# Patient Record
Sex: Female | Born: 1960 | Race: White | Hispanic: No | Smoking: Current every day smoker
Health system: Southern US, Community
[De-identification: ages and names within clinical notes are randomized; demographics above are authoritative.]

## PROBLEM LIST (undated history)

## (undated) HISTORY — PX: APPENDECTOMY: SHX54

---

## 2001-01-07 DIAGNOSIS — I671 Cerebral aneurysm, nonruptured: Secondary | ICD-10-CM

## 2001-01-07 DIAGNOSIS — I639 Cerebral infarction, unspecified: Secondary | ICD-10-CM

## 2001-01-07 HISTORY — DX: Cerebral aneurysm, nonruptured: I67.1

## 2001-01-07 HISTORY — PX: CEREBRAL ANEURYSM REPAIR: SHX164

## 2001-01-07 HISTORY — DX: Cerebral infarction, unspecified: I63.9

## 2003-02-02 ENCOUNTER — Other Ambulatory Visit: Payer: Self-pay

## 2007-01-18 ENCOUNTER — Emergency Department: Payer: Self-pay | Admitting: Emergency Medicine

## 2015-07-27 ENCOUNTER — Other Ambulatory Visit: Payer: Self-pay | Admitting: Family Medicine

## 2015-07-27 DIAGNOSIS — R590 Localized enlarged lymph nodes: Secondary | ICD-10-CM

## 2015-07-31 ENCOUNTER — Ambulatory Visit: Admission: RE | Admit: 2015-07-31 | Payer: Medicare Other | Source: Ambulatory Visit

## 2019-03-16 ENCOUNTER — Other Ambulatory Visit: Payer: Self-pay | Admitting: Unknown Physician Specialty

## 2019-03-16 DIAGNOSIS — K1370 Unspecified lesions of oral mucosa: Secondary | ICD-10-CM

## 2019-03-16 DIAGNOSIS — D3701 Neoplasm of uncertain behavior of lip: Secondary | ICD-10-CM

## 2019-03-26 ENCOUNTER — Ambulatory Visit
Admission: RE | Admit: 2019-03-26 | Discharge: 2019-03-26 | Disposition: A | Discharge status: Home / Self Care | Payer: Medicare HMO | Source: Ambulatory Visit | Attending: Unknown Physician Specialty | Admitting: Unknown Physician Specialty

## 2019-03-26 ENCOUNTER — Other Ambulatory Visit: Payer: Self-pay

## 2019-03-26 DIAGNOSIS — K1370 Unspecified lesions of oral mucosa: Secondary | ICD-10-CM | POA: Diagnosis present

## 2019-03-26 DIAGNOSIS — D3701 Neoplasm of uncertain behavior of lip: Secondary | ICD-10-CM | POA: Diagnosis present

## 2019-03-26 MED ORDER — IOHEXOL 300 MG/ML  SOLN
75.0000 mL | Freq: Once | INTRAMUSCULAR | Status: AC | PRN
  Administered 2019-03-26: 75 mL via INTRAVENOUS

## 2019-04-16 ENCOUNTER — Inpatient Hospital Stay: Admission: RE | Admit: 2019-04-16 | Payer: Medicare Other | Source: Ambulatory Visit

## 2019-04-23 ENCOUNTER — Other Ambulatory Visit: Payer: Self-pay

## 2019-04-23 ENCOUNTER — Other Ambulatory Visit: Payer: Medicare Other

## 2019-04-23 ENCOUNTER — Encounter
Admission: RE | Admit: 2019-04-23 | Discharge: 2019-04-23 | Disposition: A | Discharge status: Home / Self Care | Payer: Medicare Other | Source: Ambulatory Visit | Attending: Unknown Physician Specialty | Admitting: Unknown Physician Specialty

## 2019-04-23 NOTE — Patient Instructions (Addendum)
Your procedure is scheduled on: 05-04-19 TUESDAY Report to Same Day Surgery 2nd floor medical mall [facility] Entrance-take elevator on left to 2nd floor.  Check in with surgery information desk.) To find out your arrival time please call [phone] between 1PM - 3PM on 05-03-19 MONDAY  Remember: Instructions that are not followed completely may result in serious medical risk, up to and including death, or upon the discretion of your surgeon and anesthesiologist your surgery may need to be rescheduled.    _x___ 1. Do not eat food after midnight the night before your procedure. NO GUM OR CANDY AFTER MIDNIGHT. You may drink clear liquids up to 2 hours before you are scheduled to arrive at the hospital for your procedure.  Do not drink clear liquids within 2 hours of your scheduled arrival to the hospital.  Clear liquids include  --Water or Apple juice without pulp  --Gatorade  --Black Coffee or Clear Tea (No milk, no creamers, do not add anything to  the coffee or Tea   ____Ensure clear carbohydrate drink on the way to the hospital for bariatric patients  ____Ensure clear carbohydrate drink 3 hours before surgery.     __x__ 2. No Alcohol for 24 hours before or after surgery.   __x__3. No Smoking or e-cigarettes for 24 prior to surgery.  Do not use any chewable tobacco products for at least 6 hour prior to surgery   ____  4. Bring all medications with you on the day of surgery if instructed.    __x__ 5. Notify your doctor if there is any change in your medical condition     (cold, fever, infections).    x___6. On the morning of surgery brush your teeth with toothpaste and water.  You may rinse your mouth with mouth wash if you wish.  Do not swallow any toothpaste or mouthwash.   Do not wear jewelry, make-up, hairpins, clips or nail polish.  Do not wear lotions, powders, or perfumes. You may wear deodorant.  Do not shave 48 hours prior to surgery. Men may shave face and neck.  Do  not bring valuables to the hospital.    [facility] is not responsible for any belongings or valuables.               Contacts, dentures or bridgework may not be worn into surgery.  Leave your suitcase in the car. After surgery it may be brought to your room.  For patients admitted to the hospital, discharge time is determined by your treatment team.  _  Patients discharged the day of surgery will not be allowed to drive home.  You will need someone to drive you home and stay with you the night of your procedure.    Please read over the following fact sheets that you were given:   [facility] Preparing for Surgery   _x___ TAKE THE FOLLOWING MEDICATION THE MORNING OF SURGERY WITH A SMALL SIP OF WATER. These include:  1. GABAPENTIN (NEURONTIN)  2. BACLOFEN (LIORESAL)  3. CYMBALTA (DULOXETINE)  4.  5.  6.  ____Fleets enema or Magnesium Citrate as directed.   ____ Use CHG Soap or sage wipes as directed on instruction sheet   ____ Use inhalers on the day of surgery and bring to hospital day of surgery  ____ Stop Metformin and Janumet 2 days prior to surgery.    ____ Take 1/2 of usual insulin dose the night before surgery and none on the morning surgery.  ____ Follow recommendations from Cardiologist, Pulmonologist or PCP regarding stopping Aspirin, Coumadin, Plavix ,Eliquis, Effient, or Pradaxa, and Pletal.  X____Stop Anti-inflammatories such as Advil, Aleve, Ibuprofen, Motrin, Naproxen, Naprosyn, Goodies powders or aspirin products 7 DAYS PRIOR TO SURGERY-OK to take Tylenol    ____ Stop supplements until after surgery.    ____ Bring C-Pap to the hospital.

## 2019-04-30 ENCOUNTER — Other Ambulatory Visit
Admission: RE | Admit: 2019-04-30 | Discharge: 2019-04-30 | Disposition: A | Discharge status: Home / Self Care | Payer: Medicare HMO | Source: Ambulatory Visit | Attending: Unknown Physician Specialty | Admitting: Unknown Physician Specialty

## 2019-04-30 ENCOUNTER — Other Ambulatory Visit: Payer: Self-pay

## 2019-04-30 DIAGNOSIS — Z01812 Encounter for preprocedural laboratory examination: Secondary | ICD-10-CM | POA: Insufficient documentation

## 2019-04-30 DIAGNOSIS — Z20822 Contact with and (suspected) exposure to covid-19: Secondary | ICD-10-CM | POA: Diagnosis not present

## 2019-04-30 LAB — CBC
HCT: 42.2 % (ref 36.0–46.0)
Hemoglobin: 14.5 g/dL (ref 12.0–15.0)
MCH: 33.3 pg (ref 26.0–34.0)
MCHC: 34.4 g/dL (ref 30.0–36.0)
MCV: 97 fL (ref 80.0–100.0)
Platelets: 259 10*3/uL (ref 150–400)
RBC: 4.35 MIL/uL (ref 3.87–5.11)
RDW: 12.5 % (ref 11.5–15.5)
WBC: 6.4 10*3/uL (ref 4.0–10.5)
nRBC: 0 % (ref 0.0–0.2)

## 2019-04-30 LAB — BASIC METABOLIC PANEL
Anion gap: 9 (ref 5–15)
BUN: 13 mg/dL (ref 6–20)
CO2: 27 mmol/L (ref 22–32)
Calcium: 9 mg/dL (ref 8.9–10.3)
Chloride: 100 mmol/L (ref 98–111)
Creatinine, Ser: 0.55 mg/dL (ref 0.44–1.00)
GFR calc Af Amer: >60 mL/min (ref >60)
GFR calc non Af Amer: >60 mL/min (ref >60)
Glucose, Bld: 100 mg/dL — ABNORMAL HIGH (ref 70–99)
Potassium: 4 mmol/L (ref 3.5–5.1)
Sodium: 136 mmol/L (ref 135–145)

## 2019-04-30 LAB — SARS CORONAVIRUS 2 (TAT 6-24 HRS): SARS Coronavirus 2: NEGATIVE

## 2019-05-04 ENCOUNTER — Ambulatory Visit: Payer: Medicare HMO

## 2019-05-04 ENCOUNTER — Other Ambulatory Visit: Payer: Self-pay

## 2019-05-04 ENCOUNTER — Encounter: Payer: Self-pay | Admitting: Unknown Physician Specialty

## 2019-05-04 ENCOUNTER — Ambulatory Visit
Admission: RE | Admit: 2019-05-04 | Discharge: 2019-05-04 | Disposition: A | Discharge status: Home / Self Care | Payer: Medicare HMO | Attending: Unknown Physician Specialty | Admitting: Unknown Physician Specialty

## 2019-05-04 ENCOUNTER — Encounter
Admission: RE | Disposition: A | Discharge status: Home / Self Care | Payer: Self-pay | Source: Home / Self Care | Attending: Unknown Physician Specialty

## 2019-05-04 DIAGNOSIS — Z79899 Other long term (current) drug therapy: Secondary | ICD-10-CM | POA: Insufficient documentation

## 2019-05-04 DIAGNOSIS — F17200 Nicotine dependence, unspecified, uncomplicated: Secondary | ICD-10-CM | POA: Insufficient documentation

## 2019-05-04 DIAGNOSIS — D100 Benign neoplasm of lip: Secondary | ICD-10-CM | POA: Insufficient documentation

## 2019-05-04 DIAGNOSIS — G8194 Hemiplegia, unspecified affecting left nondominant side: Secondary | ICD-10-CM | POA: Diagnosis not present

## 2019-05-04 DIAGNOSIS — Z8679 Personal history of other diseases of the circulatory system: Secondary | ICD-10-CM | POA: Diagnosis not present

## 2019-05-04 DIAGNOSIS — D3701 Neoplasm of uncertain behavior of lip: Secondary | ICD-10-CM | POA: Diagnosis present

## 2019-05-04 HISTORY — PX: MASS EXCISION: SHX2000

## 2019-05-04 SURGERY — EXCISION MASS
Anesthesia: General

## 2019-05-04 MED ORDER — PHENYLEPHRINE HCL (PRESSORS) 10 MG/ML IV SOLN
INTRAVENOUS | Status: AC
  Filled 2019-05-04: qty 1

## 2019-05-04 MED ORDER — LIDOCAINE HCL (CARDIAC) PF 100 MG/5ML IV SOSY
PREFILLED_SYRINGE | INTRAVENOUS | Status: DC | PRN
  Administered 2019-05-04: 60 mg via INTRAVENOUS

## 2019-05-04 MED ORDER — MIDAZOLAM HCL 2 MG/2ML IJ SOLN
INTRAMUSCULAR | Status: AC
  Filled 2019-05-04: qty 2

## 2019-05-04 MED ORDER — LACTATED RINGERS IV SOLN: INTRAVENOUS | Status: DC

## 2019-05-04 MED ORDER — GLYCOPYRROLATE 0.2 MG/ML IJ SOLN
INTRAMUSCULAR | Status: DC | PRN
  Administered 2019-05-04: .2 mg via INTRAVENOUS

## 2019-05-04 MED ORDER — LIDOCAINE HCL (PF) 2 % IJ SOLN
INTRAMUSCULAR | Status: AC
  Filled 2019-05-04: qty 5

## 2019-05-04 MED ORDER — ONDANSETRON HCL 4 MG/2ML IJ SOLN: 4.0000 mg | Freq: Once | INTRAMUSCULAR | Status: DC | PRN

## 2019-05-04 MED ORDER — BACITRACIN ZINC 500 UNIT/GM EX OINT
TOPICAL_OINTMENT | CUTANEOUS | Status: AC
  Filled 2019-05-04: qty 28.35

## 2019-05-04 MED ORDER — LIDOCAINE-EPINEPHRINE (PF) 1 %-1:200000 IJ SOLN
INTRAMUSCULAR | Status: DC | PRN
  Administered 2019-05-04: 1 mL

## 2019-05-04 MED ORDER — PROPOFOL 10 MG/ML IV BOLUS
INTRAVENOUS | Status: DC | PRN
  Administered 2019-05-04: 120 mg via INTRAVENOUS
  Administered 2019-05-04: 50 mg via INTRAVENOUS

## 2019-05-04 MED ORDER — ALBUTEROL SULFATE HFA 108 (90 BASE) MCG/ACT IN AERS
INHALATION_SPRAY | RESPIRATORY_TRACT | Status: DC | PRN
  Administered 2019-05-04: 6 via RESPIRATORY_TRACT

## 2019-05-04 MED ORDER — FENTANYL CITRATE (PF) 100 MCG/2ML IJ SOLN
INTRAMUSCULAR | Status: DC | PRN
  Administered 2019-05-04 (×2): 25 ug via INTRAVENOUS
  Administered 2019-05-04: 50 ug via INTRAVENOUS

## 2019-05-04 MED ORDER — ONDANSETRON HCL 4 MG/2ML IJ SOLN
INTRAMUSCULAR | Status: DC | PRN
  Administered 2019-05-04: 4 mg via INTRAVENOUS

## 2019-05-04 MED ORDER — FENTANYL CITRATE (PF) 100 MCG/2ML IJ SOLN
INTRAMUSCULAR | Status: AC
  Filled 2019-05-04: qty 2

## 2019-05-04 MED ORDER — LIDOCAINE-EPINEPHRINE (PF) 1 %-1:200000 IJ SOLN
INTRAMUSCULAR | Status: AC
  Filled 2019-05-04: qty 30

## 2019-05-04 MED ORDER — FENTANYL CITRATE (PF) 100 MCG/2ML IJ SOLN: 25.0000 ug | INTRAMUSCULAR | Status: DC | PRN

## 2019-05-04 MED ORDER — ONDANSETRON HCL 4 MG/2ML IJ SOLN
INTRAMUSCULAR | Status: AC
  Filled 2019-05-04: qty 2

## 2019-05-04 MED ORDER — GLYCOPYRROLATE 0.2 MG/ML IJ SOLN
INTRAMUSCULAR | Status: AC
  Filled 2019-05-04: qty 1

## 2019-05-04 MED ORDER — FAMOTIDINE 20 MG PO TABS: 20.0000 mg | ORAL_TABLET | Freq: Once | ORAL | Status: AC

## 2019-05-04 MED ORDER — DEXAMETHASONE SODIUM PHOSPHATE 10 MG/ML IJ SOLN
INTRAMUSCULAR | Status: AC
  Filled 2019-05-04: qty 1

## 2019-05-04 MED ORDER — MIDAZOLAM HCL 2 MG/2ML IJ SOLN
INTRAMUSCULAR | Status: DC | PRN
  Administered 2019-05-04: 2 mg via INTRAVENOUS

## 2019-05-04 MED ORDER — SUCCINYLCHOLINE CHLORIDE 20 MG/ML IJ SOLN
INTRAMUSCULAR | Status: DC | PRN
  Administered 2019-05-04: 120 mg via INTRAVENOUS

## 2019-05-04 MED ORDER — PROPOFOL 10 MG/ML IV BOLUS
INTRAVENOUS | Status: AC
  Filled 2019-05-04: qty 40

## 2019-05-04 MED ORDER — KETOROLAC TROMETHAMINE 30 MG/ML IJ SOLN
INTRAMUSCULAR | Status: DC | PRN
  Administered 2019-05-04: 30 mg via INTRAVENOUS

## 2019-05-04 MED ORDER — DEXAMETHASONE SODIUM PHOSPHATE 10 MG/ML IJ SOLN
INTRAMUSCULAR | Status: DC | PRN
  Administered 2019-05-04: 10 mg via INTRAVENOUS

## 2019-05-04 MED ORDER — FAMOTIDINE 20 MG PO TABS
ORAL_TABLET | ORAL | Status: AC
  Administered 2019-05-04: 20 mg via ORAL
  Filled 2019-05-04: qty 1

## 2019-05-04 SURGICAL SUPPLY — 29 items
BLADE SURG 15 STRL LF DISP TIS (BLADE) ×1 IMPLANT
BLADE SURG 15 STRL SS (BLADE) ×2
CANISTER SUCT 1200ML W/VALVE (MISCELLANEOUS) ×3 IMPLANT
CORD BIP STRL DISP 12FT (MISCELLANEOUS) IMPLANT
COVER WAND RF STERILE (DRAPES) ×3 IMPLANT
DERMABOND ADVANCED (GAUZE/BANDAGES/DRESSINGS)
DERMABOND ADVANCED .7 DNX12 (GAUZE/BANDAGES/DRESSINGS) IMPLANT
DRAPE MAG INST 16X20 L/F (DRAPES) IMPLANT
DRSG TEGADERM 2-3/8X2-3/4 SM (GAUZE/BANDAGES/DRESSINGS) IMPLANT
DRSG TELFA 4X3 1S NADH ST (GAUZE/BANDAGES/DRESSINGS) ×3 IMPLANT
ELECT CAUTERY BLADE TIP 2.5 (TIP) ×3
ELECT REM PT RETURN 9FT ADLT (ELECTROSURGICAL) ×3
ELECTRODE CAUTERY BLDE TIP 2.5 (TIP) ×1 IMPLANT
ELECTRODE REM PT RTRN 9FT ADLT (ELECTROSURGICAL) ×1 IMPLANT
FORCEPS JEWEL BIP 4-3/4 STR (INSTRUMENTS) IMPLANT
GLOVE BIO SURGEON STRL SZ7.5 (GLOVE) ×3 IMPLANT
GOWN STRL REUS W/ TWL LRG LVL3 (GOWN DISPOSABLE) ×2 IMPLANT
GOWN STRL REUS W/TWL LRG LVL3 (GOWN DISPOSABLE) ×4
LABEL OR SOLS (LABEL) ×3 IMPLANT
NS IRRIG 500ML POUR BTL (IV SOLUTION) ×3 IMPLANT
PACK HEAD/NECK (MISCELLANEOUS) ×3 IMPLANT
SHEARS HARMONIC 9CM CVD (BLADE) ×3 IMPLANT
SPONGE KITTNER 5P (MISCELLANEOUS) ×3 IMPLANT
SUCTION FRAZIER HANDLE 10FR (MISCELLANEOUS) ×2
SUCTION TUBE FRAZIER 10FR DISP (MISCELLANEOUS) ×1 IMPLANT
SUT SILK 2 0 (SUTURE) ×2
SUT SILK 2-0 18XBRD TIE 12 (SUTURE) ×1 IMPLANT
SUT VIC AB 4-0 FS2 27 (SUTURE) IMPLANT
SUT VIC AB 4-0 RB1 18 (SUTURE) ×3 IMPLANT

## 2019-05-04 NOTE — H&P (Signed)
The patient's history has been reviewed, patient examined, no change in status, stable for surgery.  Questions were answered to the patients satisfaction.  

## 2019-05-04 NOTE — Anesthesia Postprocedure Evaluation (Signed)
Anesthesia Post Note  Patient: Erica Pugh  Procedure(s) Performed: EXCISION MASS of upper lip/mucosa (N/A )  Patient location during evaluation: PACU Anesthesia Type: General Level of consciousness: awake and alert and oriented Pain management: pain level controlled Vital Signs Assessment: post-procedure vital signs reviewed and stable Respiratory status: spontaneous breathing Cardiovascular status: blood pressure returned to baseline Anesthetic complications: no     Last Vitals:  Vitals:   05/04/19 0846 05/04/19 0854  BP: (!) 153/91 (!) 154/94  Pulse: 75 68  Resp: 16 16  Temp: 36.6 C   SpO2: 96% 96%    Last Pain:  Vitals:   05/04/19 0854  TempSrc:   PainSc: 0-No pain                 [name]

## 2019-05-04 NOTE — OR Nursing (Addendum)
Called Dr Tami Ribas for discharge instructions, instructions provided to Eugene Gavia per Dr Tami Ribas    Dr Kayleen Memos notified of post op vital signs, may discharge home per Dr Kayleen Memos.

## 2019-05-04 NOTE — Op Note (Signed)
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  After general endotracheal anesthesia the table was turned 90 degrees.  The left upper lip was palpated there was a submucosal mass identified.  The left upper lip was inverted and the mass was pushed against the mucosa.  This was easily palpated and visualized.  A 15 blade was used to incise through the mucosa and the mass was excised and short sharp scissors.  There are also several small normal-appearing minor salivary glands which were also excised and sent for specimen.  With the mass removed a local anesthetic of 1% lidocaine with 1 100,000's epinephrine was used to inject the surrounding mucosa.  A total of 2 cc was used.  4-0 chromic was then used to close the incision in interrupted fashion.  The patient was then returned to anesthesia where she was awakened in the operating take care of him in stable condition.  Specimen: Left upper lip submucosal mass  Dispo:   Good  Plan: Discharged home follow-up 1 week  Roena Malady  05/04/2019 7:51 AM

## 2019-05-04 NOTE — Anesthesia Preprocedure Evaluation (Addendum)
Anesthesia Evaluation  Patient identified by MRN, date of birth, ID band Patient awake    Reviewed: Allergy & Precautions, NPO status , Patient's Chart, lab work & pertinent test results  Airway Mallampati: III       Dental   Pulmonary Current Smoker,           Cardiovascular negative cardio ROS       Neuro/Psych CVA negative psych ROS   GI/Hepatic negative GI ROS, Neg liver ROS,   Endo/Other  negative endocrine ROS  Renal/GU negative Renal ROS  negative genitourinary   Musculoskeletal negative musculoskeletal ROS (+)   Abdominal   Peds negative pediatric ROS (+)  Hematology negative hematology ROS (+)   Anesthesia Other Findings Past Medical History: 2003: Brain aneurysm     Comment:  had stroke during aneurysm surgery due to coil placment 2003: Stroke [facility])     Comment:  mild stroke during brain aneurysm surgeyr-mild left               sided weakness  Reproductive/Obstetrics                             Anesthesia Physical Anesthesia Plan  ASA: III  Anesthesia Plan: General   Post-op Pain Management:    Induction: Intravenous  PONV Risk Score and Plan:   Airway Management Planned: Oral ETT  Additional Equipment:   Intra-op Plan:   Post-operative Plan: Extubation in OR  Informed Consent: I have reviewed the patients History and Physical, chart, labs and discussed the procedure including the risks, benefits and alternatives for the proposed anesthesia with the patient or authorized representative who has indicated his/her understanding and acceptance.     Dental advisory given  Plan Discussed with: CRNA and Surgeon  Anesthesia Plan Comments:        Anesthesia Quick Evaluation

## 2019-05-04 NOTE — Discharge Instructions (Signed)
AMBULATORY SURGERY  DISCHARGE INSTRUCTIONS   1) The drugs that you were given will stay in your system until tomorrow so for the next 24 hours you should not:  A) Drive an automobile B) Make any legal decisions C) Drink any alcoholic beverage   2) You may resume regular meals tomorrow.  Today it is better to start with liquids and gradually work up to solid foods.  You may eat anything you prefer, but it is better to start with liquids, then soup and crackers, and gradually work up to solid foods.   3) Please notify your doctor immediately if you have any unusual bleeding, trouble breathing, redness and pain at the surgery site, drainage, fever, or pain not relieved by medication.    4) Additional Instructions: Call MD or return to ER with any excessive bleeding, signs of infection or concerns.          Please contact your physician with any problems or Same Day Surgery at [phone], Monday through Friday 6 am to 4 pm, or [place] at [facility] number at [phone].

## 2019-05-04 NOTE — Anesthesia Procedure Notes (Signed)
Procedure Name: Intubation Performed by: Shirk, Victoria, RN Pre-anesthesia Checklist: Patient identified, Patient being monitored, Timeout performed, Emergency Drugs available and Suction available Patient Re-evaluated:Patient Re-evaluated prior to induction Oxygen Delivery Method: Circle system utilized Preoxygenation: Pre-oxygenation with 100% oxygen Induction Type: IV induction Ventilation: Mask ventilation without difficulty Laryngoscope Size: Mac and 3 Grade View: Grade I Tube type: Oral Tube size: 7.0 mm Number of attempts: 1 Airway Equipment and Method: Stylet Placement Confirmation: ETT inserted through vocal cords under direct vision,  positive ETCO2 and breath sounds checked- equal and bilateral Secured at: 21 cm Tube secured with: Tape Dental Injury: Teeth and Oropharynx as per pre-operative assessment        

## 2019-05-04 NOTE — Transfer of Care (Signed)
Immediate Anesthesia Transfer of Care Note  Patient: Erica Pugh  Procedure(s) Performed: EXCISION MASS of upper lip/mucosa (N/A )  Patient Location: PACU  Anesthesia Type:General  Level of Consciousness: sedated  Airway & Oxygen Therapy: Patient Spontanous Breathing and Patient connected to face mask oxygen  Post-op Assessment: Report given to RN and Post -op Vital signs reviewed and stable  Post vital signs: Reviewed and stable  Last Vitals:  Vitals Value Taken Time  BP 100/67 05/04/19 0800  Temp 36.5 C 05/04/19 0800  Pulse 91 05/04/19 0801  Resp 20 05/04/19 0801  SpO2 97 % 05/04/19 0801  Vitals shown include unvalidated device data.  Last Pain:  Vitals:   05/04/19 0616  TempSrc: Temporal  PainSc: 0-No pain         Complications: No apparent anesthesia complications

## 2019-05-05 LAB — SURGICAL PATHOLOGY

## 2022-01-22 IMAGING — CT CT MAXILLOFACIAL W/ CM
1 series · 1 of 1 positions shown · IV contrast (omnipaque)
Comparison: None.

CLINICAL DATA: Lump near lip

EXAM:
CT MAXILLOFACIAL WITH CONTRAST
TECHNIQUE: Multidetector CT imaging of the maxillofacial structures was
performed with intravenous contrast. Multiplanar CT image
reconstructions were also generated.
CONTRAST:  75mL OMNIPAQUE IOHEXOL 300 MG/ML  SOLN

[Series 1: topogram 0.60 sag · sagittal · 1.00mm/px · 1 of 1 slices shown]
[im 1/1]
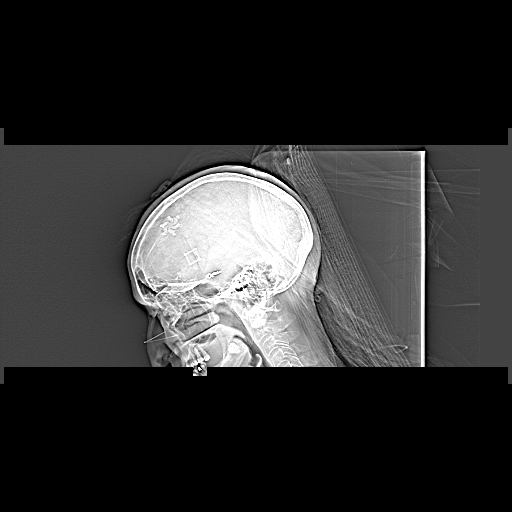

[1 of 1 positions shown; findings below may reference images not displayed]

FINDINGS: Osseous: Partially imaged right pterional craniotomy/cranioplasty.

Orbits: Unremarkable.

Sinuses: Aerated.

Soft tissues: A skin marker is not present. There is no mass or
adenopathy identified.

Limited intracranial: Postoperative changes with streak artifact
related to aneurysm clipping. No abnormal enhancement identified.
IMPRESSION: No neck mass identified.
